# Patient Record
Sex: Female | Born: 2000 | Race: White | Hispanic: No | Marital: Single | Smoking: Never smoker
Health system: Southern US, Community
[De-identification: ages and names within clinical notes are randomized; demographics above are authoritative.]

## PROBLEM LIST (undated history)

## (undated) DIAGNOSIS — R569 Unspecified convulsions: Secondary | ICD-10-CM

## (undated) DIAGNOSIS — T7840XA Allergy, unspecified, initial encounter: Secondary | ICD-10-CM

## (undated) HISTORY — DX: Unspecified convulsions: R56.9

## (undated) HISTORY — DX: Allergy, unspecified, initial encounter: T78.40XA

---
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
## 2018-03-25 ENCOUNTER — Ambulatory Visit (INDEPENDENT_AMBULATORY_CARE_PROVIDER_SITE_OTHER): Payer: BLUE CROSS/BLUE SHIELD | Admitting: Pediatrics

## 2018-03-25 ENCOUNTER — Encounter: Payer: Self-pay | Admitting: Pediatrics

## 2018-03-25 VITALS — BP 110/70 | Ht 61.25 in | Wt 106.3 lb

## 2018-03-25 DIAGNOSIS — Z68.41 Body mass index (BMI) pediatric, 5th percentile to less than 85th percentile for age: Secondary | ICD-10-CM

## 2018-03-25 DIAGNOSIS — Z0000 Encounter for general adult medical examination without abnormal findings: Secondary | ICD-10-CM

## 2018-03-25 DIAGNOSIS — Z00129 Encounter for routine child health examination without abnormal findings: Principal | ICD-10-CM

## 2018-03-25 MED ORDER — MOMETASONE FUROATE 0.1 % EX CREA: TOPICAL_CREAM | CUTANEOUS | 12 refills | Status: AC

## 2018-03-25 MED ORDER — CLINDAMYCIN PHOSPHATE 1 % EX SOLN: Freq: Two times a day (BID) | CUTANEOUS | 12 refills | Status: AC

## 2018-03-25 NOTE — Patient Instructions (Signed)
Acne  Acne is a skin problem that causes small, red bumps (pimples). Acne happens when the tiny holes in your skin (pores) get blocked. Your pores may become red, sore, and swollen. They may also become infected. Acne is a common skin problem. It is especially common in teenagers. Acne usually goes away over time.  Follow these instructions at home:  Good skin care is the most important thing you can do to treat your acne. Take care of your skin as told by your doctor. You may be told to do these things:  · Wash your skin gently at least two times each day. You should also wash your skin:  ? After you exercise.  ? Before you go to bed.  · Use mild soap.  · Use a water-based skin moisturizer after you wash your skin.  · Use a sunscreen or sunblock with SPF 30 or greater. This is very important if you are using acne medicines.  · Choose cosmetics that will not plug your oil glands (are noncomedogenic).    Medicines  · Take over-the-counter and prescription medicines only as told by your doctor.  · If you were prescribed an antibiotic medicine, apply or take it as told by your doctor. Do not stop using the antibiotic even if your acne improves.  General instructions  · Keep your hair clean and off of your face. Shampoo your hair regularly. If you have oily hair, you may need to wash it every day.  · Avoid leaning your chin or forehead on your hands.  · Avoid wearing tight headbands or hats.  · Avoid picking or squeezing your pimples. That can make your acne worse and cause scarring.  · Keep all follow-up visits as told by your doctor. This is important.  · Shave gently. Only shave when it is necessary.  · Keep a food journal. This can help you to see if any foods are linked with your acne.  Contact a doctor if:  · Your acne is not better after eight weeks.  · Your acne gets worse.  · You have a large area of skin that is red or tender.  · You think that you are having side effects from any acne medicine.  This  information is not intended to replace advice given to you by your health care provider. Make sure you discuss any questions you have with your health care provider.  Document Released: 05/29/2011 Document Revised: 11/15/2015 Document Reviewed: 08/16/2014  Elsevier Interactive Patient Education © 2018 [employer] Inc[employer]

## 2018-03-25 NOTE — Progress Notes (Signed)
Adolescent Well Care Visit Tara Faulkner is a 17 y.o. female who is here for well care.    PCP:  Georgiann Hahn, MD   History was provided by the patient and mother.  Confidentiality was discussed with the patient and, if applicable, with caregiver as well. Patient's personal or confidential phone number: n/a   Current Issues: Current concerns include .  Carpal tunnel --right wrist--refer to ortho--shoulder and  neck pain Acne  Nutrition: Nutrition/Eating Behaviors: normal  Adequate calcium in diet?: yes Supplements/ Vitamins: yes  Exercise/ Media: Play any Sports?/ Exercise: yes Screen Time:  < 2 hours Media Rules or Monitoring?: yes  Sleep:  Sleep: good  Social Screening: Lives with:  parents Parental relations:  good Activities, Work, and Regulatory affairs officer?: yes Concerns regarding behavior with peers?  no Stressors of note: no  Education: Completed school---taking this year off before college School performance: doing well; no concerns School Behavior: doing well; no concerns  Menstruation:   No LMP recorded. Menstrual History: normal   Confidential Social History: Tobacco?  no Secondhand smoke exposure?  no Drugs/ETOH?  no  Sexually Active?  no   Pregnancy Prevention: abstinence  Safe at home, in school & in relationships?  Yes Safe to self?  Yes   Screenings: Patient has a dental home: yes  The patient completed the Rapid Assessment for Adolescent Preventive Services screening questionnaire and the following topics were identified as risk factors and discussed: healthy eating, exercise, seatbelt use, bullying, abuse/trauma, weapon use, tobacco use, marijuana use, drug use, condom use, birth control, sexuality, suicidality/self harm, mental health issues, social isolation, school problems, family problems and screen time  In addition, the following topics were discussed as part of anticipatory guidance healthy eating and exercise.  PHQ-9 completed and  results indicated No risk  Physical Exam:  Vitals:   03/25/18 1524  BP: 110/70  Weight: 106 lb 4.8 oz (48.2 kg)  Height: 5' 1.25" (1.556 m)   BP 110/70   Ht 5' 1.25" (1.556 m)   Wt 106 lb 4.8 oz (48.2 kg)   BMI 19.92 kg/m  Body mass index: body mass index is 19.92 kg/m. Blood pressure percentiles are not available for patients who are 18 years or older.   Visual Acuity Screening   Right eye Left eye Both eyes  Without correction:     With correction: 10/8 10/8     General Appearance:   alert, oriented, no acute distress and well nourished  HENT: Normocephalic, no obvious abnormality, conjunctiva clear  Mouth:   Normal appearing teeth, no obvious discoloration, dental caries, or dental caps  Neck:   Supple; thyroid: no enlargement, symmetric, no tenderness/mass/nodules  Chest n/a  Lungs:   Clear to auscultation bilaterally, normal work of breathing  Heart:   Regular rate and rhythm, S1 and S2 normal, no murmurs;   Abdomen:   Soft, non-tender, no mass, or organomegaly  GU genitalia not examined  Musculoskeletal:   Tone and strength strong and symmetrical, all extremities               Lymphatic:   No cervical adenopathy  Skin/Hair/Nails:   Skin warm, dry and intact, no rashes, no bruises or petechiae  Neurologic:   Strength, gait, and coordination normal and age-appropriate     Assessment and Plan:   Well young adult  BMI is appropriate for age  Hearing screening result:normal Vision screening result: normal   Counseled on men B--uses and benefits and written literature given  Return in about 1 year (around 03/26/2019).Georgiann Hahn, MD

## 2019-05-14 ENCOUNTER — Emergency Department (HOSPITAL_COMMUNITY)
Admission: EM | Admit: 2019-05-14 | Discharge: 2019-05-14 | Disposition: A | Discharge status: Home / Self Care | Payer: BC Managed Care – PPO | Attending: Emergency Medicine | Admitting: Emergency Medicine

## 2019-05-14 ENCOUNTER — Other Ambulatory Visit: Payer: Self-pay

## 2019-05-14 ENCOUNTER — Emergency Department (HOSPITAL_COMMUNITY): Payer: BC Managed Care – PPO

## 2019-05-14 ENCOUNTER — Encounter (HOSPITAL_COMMUNITY): Payer: Self-pay | Admitting: Emergency Medicine

## 2019-05-14 DIAGNOSIS — R000 Tachycardia, unspecified: Secondary | ICD-10-CM | POA: Diagnosis not present

## 2019-05-14 DIAGNOSIS — R259 Unspecified abnormal involuntary movements: Secondary | ICD-10-CM | POA: Insufficient documentation

## 2019-05-14 DIAGNOSIS — R569 Unspecified convulsions: Secondary | ICD-10-CM | POA: Diagnosis not present

## 2019-05-14 DIAGNOSIS — Z79899 Other long term (current) drug therapy: Secondary | ICD-10-CM | POA: Insufficient documentation

## 2019-05-14 DIAGNOSIS — R270 Ataxia, unspecified: Secondary | ICD-10-CM | POA: Diagnosis not present

## 2019-05-14 LAB — URINALYSIS, ROUTINE W REFLEX MICROSCOPIC
Bilirubin Urine: NEGATIVE
Glucose, UA: NEGATIVE mg/dL
Hgb urine dipstick: NEGATIVE
Ketones, ur: NEGATIVE mg/dL
Leukocytes,Ua: NEGATIVE
Nitrite: NEGATIVE
Protein, ur: NEGATIVE mg/dL
Specific Gravity, Urine: 1.02 (ref 1.005–1.030)
pH: 5 (ref 5.0–8.0)

## 2019-05-14 LAB — BASIC METABOLIC PANEL
Anion gap: 11 (ref 5–15)
BUN: 10 mg/dL (ref 6–20)
CO2: 23 mmol/L (ref 22–32)
Calcium: 9.8 mg/dL (ref 8.9–10.3)
Chloride: 108 mmol/L (ref 98–111)
Creatinine, Ser: 0.78 mg/dL (ref 0.44–1.00)
GFR calc Af Amer: >60 mL/min (ref >60)
GFR calc non Af Amer: >60 mL/min (ref >60)
Glucose, Bld: 121 mg/dL — ABNORMAL HIGH (ref 70–99)
Potassium: 3.7 mmol/L (ref 3.5–5.1)
Sodium: 142 mmol/L (ref 135–145)

## 2019-05-14 LAB — CBC WITH DIFFERENTIAL/PLATELET
Abs Immature Granulocytes: 0.01 10*3/uL (ref 0.00–0.07)
Basophils Absolute: 0 10*3/uL (ref 0.0–0.1)
Basophils Relative: 0 %
Eosinophils Absolute: 0 10*3/uL (ref 0.0–0.5)
Eosinophils Relative: 0 %
HCT: 43.9 % (ref 36.0–46.0)
Hemoglobin: 14.5 g/dL (ref 12.0–15.0)
Immature Granulocytes: 0 %
Lymphocytes Relative: 21 %
Lymphs Abs: 1.5 10*3/uL (ref 0.7–4.0)
MCH: 29.1 pg (ref 26.0–34.0)
MCHC: 33 g/dL (ref 30.0–36.0)
MCV: 88 fL (ref 80.0–100.0)
Monocytes Absolute: 0.4 10*3/uL (ref 0.1–1.0)
Monocytes Relative: 6 %
Neutro Abs: 5 10*3/uL (ref 1.7–7.7)
Neutrophils Relative %: 73 %
Platelets: 226 10*3/uL (ref 150–400)
RBC: 4.99 MIL/uL (ref 3.87–5.11)
RDW: 11.9 % (ref 11.5–15.5)
WBC: 6.9 10*3/uL (ref 4.0–10.5)
nRBC: 0 % (ref 0.0–0.2)

## 2019-05-14 LAB — RAPID URINE DRUG SCREEN, HOSP PERFORMED
Amphetamines: NOT DETECTED
Barbiturates: NOT DETECTED
Benzodiazepines: NOT DETECTED
Cocaine: NOT DETECTED
Opiates: NOT DETECTED
Tetrahydrocannabinol: NOT DETECTED

## 2019-05-14 LAB — I-STAT BETA HCG BLOOD, ED (MC, WL, AP ONLY): I-stat hCG, quantitative: <5 m[IU]/mL (ref <5)

## 2019-05-14 MED ORDER — DIPHENHYDRAMINE HCL 25 MG PO CAPS
25.0000 mg | ORAL_CAPSULE | Freq: Once | ORAL | Status: DC
  Filled 2019-05-14: qty 1

## 2019-05-14 MED ORDER — LORAZEPAM 2 MG/ML IJ SOLN
2.0000 mg | Freq: Once | INTRAMUSCULAR | Status: AC
  Administered 2019-05-14: 2 mg via INTRAVENOUS
  Filled 2019-05-14: qty 1

## 2019-05-14 MED ORDER — DIPHENHYDRAMINE HCL 25 MG PO CAPS
25.0000 mg | ORAL_CAPSULE | Freq: Once | ORAL | Status: AC
  Administered 2019-05-14: 25 mg via ORAL
  Filled 2019-05-14: qty 1

## 2019-05-14 MED ORDER — ONDANSETRON 8 MG PO TBDP
8.0000 mg | ORAL_TABLET | Freq: Once | ORAL | Status: AC
  Administered 2019-05-14: 8 mg via ORAL
  Filled 2019-05-14: qty 1

## 2019-05-14 NOTE — ED Notes (Signed)
ED Provider at bedside. 

## 2019-05-14 NOTE — ED Notes (Signed)
Patient transported to MRI 

## 2019-05-14 NOTE — ED Notes (Signed)
Attempted to get an EKG, unable to because patient's twitching was too bad.

## 2019-05-14 NOTE — Consult Note (Signed)
NEURO HOSPITALIST CONSULT NOTE   Requestig physician: Dr. Billy Fischer  Reason for Consult: Abnormal movements  History obtained from:   Patient and Chart     HPI:                                                                                                                                          Tara Faulkner is an 18 y.o. female who presents with a one week history of lower and upper extremity abnormal movements. She states that her friend developed this problem several weeks ago and still had abnormal movements and paroxysmal inappropriate verbal output as of their last spending time together on Thursday 11/12. The patient's own symptoms began on Sunday with onset of abnormal movements of her BLE, which gradually migrated up her legs, then involving her arms and neck. She has also developed abnormal verbal output with spontaneous uncontrollable utterances of "color words" such as "green" and "red" while she was in the [facility] ED. She has a history of anxiety and seasonal depression for which she is not medicated. She states that she also has a history of obsessions and compulsions, specifically intrusive thoughts followed by a strong urge to clean her room etc in order to suppress/control the intrusive thoughts. She was sent over from [facility] for an MRI and possibly an EEG.   She denies any other symptoms, including no fever or symptoms of infection. Denies recent history of strep throat.   Past Medical History:  Diagnosis Date  . Allergy     History reviewed. No pertinent surgical history.  Family History  Problem Relation Age of Onset  . Cancer Maternal Grandmother        Uterine  . Cancer Maternal Grandfather        Liver ca  . Hypertension Maternal Grandfather   . Diabetes Paternal Grandmother   . Arthritis Neg Hx   . Asthma Neg Hx   . COPD Neg Hx   . Depression Neg Hx   . Hyperlipidemia Neg Hx   . Heart disease Neg Hx   . Hearing loss Neg Hx   . Early death Neg  Hx   . Drug abuse Neg Hx   . Kidney disease Neg Hx   . Learning disabilities Neg Hx   . Mental illness Neg Hx   . Mental retardation Neg Hx   . Miscarriages / Stillbirths Neg Hx   . Stroke Neg Hx   . Vision loss Neg Hx   . Alcohol abuse Neg Hx   . Varicose Veins Neg Hx               Social History:  reports that she has never smoked. She has never used smokeless tobacco. She reports that she does not drink alcohol or  use drugs.  No Known Allergies  HOME MEDICATIONS:                                                                                                                     No current facility-administered medications on file prior to encounter.    Current Outpatient Medications on File Prior to Encounter  Medication Sig Dispense Refill  . desonide (DESOWEN) 0.05 % cream APPLY TOPICALLY 2 (TWO) TIMES DAILY. (Patient taking differently: Apply 1 application topically 2 (two) times daily. ) 60 g 6  . loratadine (CLARITIN) 10 MG tablet Take 10 mg by mouth daily.    . mometasone (ELOCON) 0.1 % cream Apply 1 application topically daily.    Marland Kitchen. ELIDEL 1 % cream APPLY TWICE DAILY TO RASH UNTIL CLEAR (Patient taking differently: Apply 1 application topically 2 (two) times daily. ) 60 g 1     ROS:                                                                                                                                       As per HPI. Comprehensive ROS is otherwise negative.    Blood pressure 116/83, pulse (!) 101, temperature 99.6 F (37.6 C), temperature source Oral, resp. rate 14, height 5\' 1"  (1.549 m), weight 49.9 kg, last menstrual period 04/17/2019, SpO2 97 %.   General Examination:                                                                                                       Physical Exam  HEENT-  [place]/AT    Lungs- Respirations unlabored.  Extremities- No edema   Neurological Examination Mental Status: Alert, fully oriented, thought content appropriate.   Speech fluent without evidence of aphasia.  Able to follow all commands without difficulty. No vocal tics noted.  Cranial Nerves: II: Visual fields intact with no extinction to DSS. PERRL.  III,IV, VI: No ptosis. EOMI without nystagmus.  V,VII: Face symmetric. Temp and FT sensation  equal bilaterally  VIII: hearing intact to conversation IX,X: No hypophonia XI: Symmetric XII: midline tongue extension Motor: Right : Upper extremity   5/5    Left:     Upper extremity   5/5  Lower extremity   5/5     Lower extremity   5/5 No pronator drift. No tremor. Tone and bulk normal.  Exhibits intermittent contorted movements of upper > lower extremities involving multiple joints when occurring, varying in amplitude and direction as well as the specific joints involved. The movements are complex and not stereotyped, being different with each occurrence. Example includes flexion at elbow while adducting at shoulder, crossing arm over chest and snapping her fingers (left arm), while flexing her RUE and crossing it over her chest, with simultaneous flexion of the neck forwards and laterally. No facial tics noted. Movements become worse when discussing the movements themselves, as well as when asking about possible psychosocial stressors. The movements were absent for about one minute after examiner entered the room, then gradually increased in frequency and severity during the course of the interview/exam.  Sensory: Temp and light touch intact throughout, bilaterally. No extinction.  Deep Tendon Reflexes: 2+ and symmetric throughout Plantars: Right: downgoing   Left: downgoing Cerebellar: No ataxia with FNF bilaterally.  Gait: Deferred   Lab Results: Basic Metabolic Panel: Recent Labs  Lab 05/14/19 0553  NA 142  K 3.7  CL 108  CO2 23  GLUCOSE 121*  BUN 10  CREATININE 0.78  CALCIUM 9.8    CBC: Recent Labs  Lab 05/14/19 0553  WBC 6.9  NEUTROABS 5.0  HGB 14.5  HCT 43.9  MCV 88.0  PLT 226     Cardiac Enzymes: No results for input(s): CKTOTAL, CKMB, CKMBINDEX, TROPONINI in the last 168 hours.  Lipid Panel: No results for input(s): CHOL, TRIG, HDL, CHOLHDL, VLDL, LDLCALC in the last 168 hours.  Imaging: Ct Head Wo Contrast  Result Date: 05/14/2019 CLINICAL DATA:  Ataxia, stroke suspected. Twitching of arms and upper body. EXAM: CT HEAD WITHOUT CONTRAST TECHNIQUE: Contiguous axial images were obtained from the base of the skull through the vertex without intravenous contrast. COMPARISON:  None. FINDINGS: Brain: Uncontrollable motion artifact. No intracranial hemorrhage, mass effect, or midline shift. No hydrocephalus. The basilar cisterns are patent. No evidence of territorial infarct or acute ischemia. No extra-axial or intracranial fluid collection. Vascular: No obvious hyperdense vessel, motion artifact through the area of the MCAs. Skull: Normal. Negative for fracture or focal lesion. Sinuses/Orbits: Paranasal sinuses and mastoid air cells are clear. The visualized orbits are unremarkable. Other: None. IMPRESSION: 1. Uncontrollable motion artifact limits the exam. 2. No evidence of acute intracranial abnormality. Electronically Signed   By: Narda Rutherford M.D.   On: 05/14/2019 06:13   Mr Brain Wo Contrast  Result Date: 05/14/2019 CLINICAL DATA:  Seizure, new, nontraumatic EXAM: MRI HEAD WITHOUT CONTRAST TECHNIQUE: Multiplanar, multiecho pulse sequences of the brain and surrounding structures were obtained without intravenous contrast. COMPARISON:  Head CT earlier today FINDINGS: Brain: No infarction, hemorrhage, hydrocephalus, extra-axial collection or mass lesion. Normal brain volume. No white matter disease. Vascular: Normal flow voids Skull and upper cervical spine: Normal marrow signal Sinuses/Orbits: Presumed retention cyst in the inferior right maxillary sinus. Negative orbits IMPRESSION: Normal brain MRI. Electronically Signed   By: Marnee Spring M.D.   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 Overall impression is that this is not secondary to an  autoimmune encephalopathy or Tourette's syndrome. 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 2. MRI brain is normal. Images were reviewed with the patient and her father 3. Movements are not consistent with epileptic seizure activity. EEG not indicated.   Recommendations: 1. Psychodynamic psychotherapy outpatient appointment is recommended 2. Follow up with [place] [facility] (Dr. Arbutus Leas) for second opinion. 3. The patient would like to defer possible medication until after her appointment at [facility]. Discussed benefits risks of Prozac and Buspar for depression/anxiety and anxiety treatment, respectively.  4. The patient has consented to a one time dose of 2 mg IV Ativan to assess efficacy of anxiolytic medication.   Electronically signed: Dr. Caryl Pina 05/14/2019, 11:23 AM

## 2019-05-14 NOTE — ED Triage Notes (Signed)
Patient is having twitching involving her arms and upper body that started about an hour ago. She is tachycardic in triage, unable to control body movements.

## 2019-05-14 NOTE — ED Provider Notes (Signed)
  Physical Exam  BP 116/83   Pulse (!) 101   Temp 99.6 F (37.6 C) (Oral)   Resp 14   Ht 5\' 1"  (1.549 m)   Wt 49.9 kg   LMP 04/17/2019   SpO2 97%   BMI 20.78 kg/m   Physical Exam  ED Course/Procedures     Procedures  MDM  Received care of pt transferred from Dr. Stark Jock at [facility] to [facility] for Neurology evaluation.  18yo female presenting with abnormal movements.  Friend also noted to have similar symptoms develop one month ago.  No medications/drug use. No fever, no sign of encephalitis.    MRI without acute abnormalities. Dr. Cheral Marker Neurology evaluated the patient, feels movements are not consistent with seizure like activity, do not see other emergent etiology of symptoms.  Recommend follow up with movement clinic Neurologist, provided number.       Gareth Morgan, MD 05/14/19 2259

## 2019-05-14 NOTE — ED Triage Notes (Addendum)
Pt c/o twitching to legs and arms that started yesterday.  Reports stuttering and snapping that started today.  States she is unable to control body movements.  Pt transferred from [facility] to see neurologist.

## 2019-05-14 NOTE — ED Notes (Signed)
Patient verbalizes understanding of discharge instructions. Opportunity for questioning and answers were provided. Armband removed by staff, pt discharged from ED.  

## 2019-05-14 NOTE — ED Provider Notes (Signed)
[facility]-EMERGENCY DEPT Provider Note   CSN: [id number] Arrival date & time: 05/14/19  0049     History   Chief Complaint Chief Complaint  Patient presents with  . body twitching    HPI Tara Faulkner is a 18 y.o. female.     Patient is a 18 year old female with no significant past medical history.  She presents today with complaints of involuntary twitching of her entire body.  This is been occurring for the past several hours.  Patient states that she has been doing research on Tourette's for the past several months because she finds it interesting.  She now believes that she has developed this disease.  She denies any drug or alcohol use.  She denies any fever or chills.  The history is provided by the patient.    Past Medical History:  Diagnosis Date  . Allergy     Patient Active Problem List   Diagnosis Date Noted  . BMI (body mass index), pediatric, 5% to less than 85% for age 82/02/2015  . Well adolescent visit 01/24/2013    History reviewed. No pertinent surgical history.   OB History   No obstetric history on file.      Home Medications    Prior to Admission medications   Medication Sig Start Date End Date Taking? Authorizing Provider  desonide (DESOWEN) 0.05 % cream APPLY TOPICALLY 2 (TWO) TIMES DAILY. 02/04/16   Georgiann Hahn, MD  ELIDEL 1 % cream APPLY TWICE DAILY TO RASH UNTIL CLEAR 03/28/13   Georgiann Hahn, MD    Family History Family History  Problem Relation Age of Onset  . Cancer Maternal Grandmother        Uterine  . Cancer Maternal Grandfather        Liver ca  . Hypertension Maternal Grandfather   . Diabetes Paternal Grandmother   . Arthritis Neg Hx   . Asthma Neg Hx   . COPD Neg Hx   . Depression Neg Hx   . Hyperlipidemia Neg Hx   . Heart disease Neg Hx   . Hearing loss Neg Hx   . Early death Neg Hx   . Drug abuse Neg Hx   . Kidney disease Neg Hx   . Learning disabilities Neg Hx   . Mental illness  Neg Hx   . Mental retardation Neg Hx   . Miscarriages / Stillbirths Neg Hx   . Stroke Neg Hx   . Vision loss Neg Hx   . Alcohol abuse Neg Hx   . Varicose Veins Neg Hx     Social History Social History   Tobacco Use  . Smoking status: Never Smoker  . Smokeless tobacco: Never Used  Substance Use Topics  . Alcohol use: No  . Drug use: No     Allergies   Patient has no known allergies.   Review of Systems Review of Systems  All other systems reviewed and are negative.    Physical Exam Updated Vital Signs BP 117/79 (BP Location: Left Arm)   Pulse (!) 110   Temp 99.2 F (37.3 C) (Oral)   Resp 16   Ht 5\' 1"  (1.549 m)   Wt 49.9 kg   SpO2 98%   BMI 20.78 kg/m   Physical Exam Vitals signs and nursing note reviewed.  Constitutional:      General: She is not in acute distress.    Appearance: She is well-developed. She is not diaphoretic.  HENT:     Head:  Normocephalic and atraumatic.  Neck:     Musculoskeletal: Normal range of motion and neck supple.  Cardiovascular:     Rate and Rhythm: Normal rate and regular rhythm.     Heart sounds: No murmur. No friction rub. No gallop.   Pulmonary:     Effort: Pulmonary effort is normal. No respiratory distress.     Breath sounds: Normal breath sounds. No wheezing.  Abdominal:     General: Bowel sounds are normal. There is no distension.     Palpations: Abdomen is soft.     Tenderness: There is no abdominal tenderness.  Musculoskeletal: Normal range of motion.  Skin:    General: Skin is warm and dry.  Neurological:     General: No focal deficit present.     Mental Status: She is alert and oriented to person, place, and time.     Cranial Nerves: No cranial nerve deficit.     Motor: No weakness.     Coordination: Coordination normal.     Comments: Every few seconds, patient has a jerking motion that appears voluntary.  Patient observed from outside the room and did not have this occur.  It only seems to occur while I am  in the exam room speaking with her.      ED Treatments / Results  Labs (all labs ordered are listed, but only abnormal results are displayed) Labs Reviewed  BASIC METABOLIC PANEL  CBC WITH DIFFERENTIAL/PLATELET  I-STAT BETA HCG BLOOD, ED ([facility], [facility], [facility] ONLY)    EKG None  Radiology No results found.  Procedures Procedures (including critical care time)  Medications Ordered in ED Medications  diphenhydrAMINE (BENADRYL) capsule 25 mg (has no administration in time range)  diphenhydrAMINE (BENADRYL) capsule 25 mg (25 mg Oral Given 05/14/19 0301)     Initial Impression / Assessment and Plan / ED Course  I have reviewed the triage vital signs and the nursing notes.  Pertinent labs & imaging results that were available during my care of the patient were reviewed by me and considered in my medical decision making (see chart for details).  Patient presenting with complaints of twitching as described in the HPI.  I am uncertain as to the etiology of this, but she otherwise appears neurologically intact.  Her head CT is negative and laboratory studies are unremarkable.  I have discussed the care with Dr. Rory Percy from neurology.  He feels as though the patient would be best served by transfer to [facility] for formal neurologic consultation and possible EEG/MRI.  Patient to be transported private auto by her father who was present at bedside.  Final Clinical Impressions(s) / ED Diagnoses   Final diagnoses:  None    ED Discharge Orders    None       Veryl Speak, MD 05/14/19 [phone]

## 2019-05-14 NOTE — ED Notes (Signed)
While patient is asleep, no twitching noted.

## 2019-05-16 ENCOUNTER — Institutional Professional Consult (permissible substitution): Payer: BLUE CROSS/BLUE SHIELD | Admitting: Pediatrics

## 2019-05-16 ENCOUNTER — Telehealth: Payer: Self-pay | Admitting: Pediatrics

## 2019-05-16 NOTE — Telephone Encounter (Deleted)
Child was seen in ER for tourettes like symptoms on Friday . Needs to be seen for follow up but ER DR seems to think it's all in her head .

## 2019-05-17 NOTE — Telephone Encounter (Signed)
Virtual visit for follow up done

## 2019-06-03 ENCOUNTER — Other Ambulatory Visit: Payer: Self-pay

## 2019-06-03 ENCOUNTER — Ambulatory Visit: Payer: BC Managed Care – PPO | Admitting: Diagnostic Neuroimaging

## 2019-06-03 ENCOUNTER — Encounter: Payer: Self-pay | Admitting: Diagnostic Neuroimaging

## 2019-06-03 VITALS — BP 128/86 | HR 133 | Temp 97.6°F | Ht 61.0 in | Wt 106.2 lb

## 2019-06-03 DIAGNOSIS — R259 Unspecified abnormal involuntary movements: Secondary | ICD-10-CM | POA: Diagnosis not present

## 2019-06-03 DIAGNOSIS — F419 Anxiety disorder, unspecified: Secondary | ICD-10-CM | POA: Diagnosis not present

## 2019-06-03 DIAGNOSIS — R250 Abnormal head movements: Secondary | ICD-10-CM | POA: Diagnosis not present

## 2019-06-03 DIAGNOSIS — F429 Obsessive-compulsive disorder, unspecified: Secondary | ICD-10-CM

## 2019-06-03 DIAGNOSIS — R6889 Other general symptoms and signs: Secondary | ICD-10-CM | POA: Diagnosis not present

## 2019-06-03 DIAGNOSIS — R799 Abnormal finding of blood chemistry, unspecified: Secondary | ICD-10-CM | POA: Diagnosis not present

## 2019-06-03 NOTE — Patient Instructions (Signed)
ABNORMAL INVOLUNTARY MOVEMENTS - possible conversion reaction after exposure to knowledge of friend's similar symptoms - not likely tourette's syndrome due to sudden onset of symptoms; triggered event (friend's symptoms) and age of onset (18 years old) - recommend trial of psychology counseling - may consider SSRI in future

## 2019-06-03 NOTE — Progress Notes (Signed)
[facility]  PATIENT: Tara Faulkner DOB: 04/10/2001  REFERRING CLINICIAN: Schlossman HISTORY FROM: patient  REASON FOR VISIT: new consult   HISTORICAL  CHIEF COMPLAINT:  Chief Complaint  Patient presents with  . New Patient (Initial Visit)    Rm 7, alone (mom out in waiting area).  No family hx that she is aware.  Marland Kitchen. Possible tourettes    Started fairly recent twitches for month.  Started legs then gradually moved up.      HISTORY OF PRESENT ILLNESS:   18 year old female here for evaluation of abnormal involuntary movements.    Patient has history of anxiety and seasonal depression.  Patient had normal birth and development.  She has been homeschooled until age 18 years old.  She has been living at home since that time, thinking about going to college but she is not sure yet.  Patient did have slightly increased depression symptoms in 2018 where she "did not move that much" at home and thought about seeing a counselor or getting a medication, but did not pursue this.  Early November 2020, patient's friend developed involuntary movements and twitching and shared a video with the patient. Within 1 to 2 weeks the patient started developing similar symptoms herself around May 09, 2019.  Patient describes an uncontrollable urge and need to move her head, neck, shoulders, hands, sometimes associated with making a sound or seeing a particular word.  Sometimes these movements and sounds can be triggered if someone says a particular phrase to her where she sees something.  Apparently patient's brother even intentionally did this to the patient to see if he can trigger these vocalizations and the patient.  Symptoms gradually built up until May 14, 2019 when the severity of symptoms led patient to go to the emergency room for evaluation.  Lab testing, CT and MRI were obtained.  Neurology consultation was obtained.  All testing was essentially unremarkable.   Consideration of possible psychogenic movement disorder or conversion reaction was raised.  Seizure and Tourette syndrome were not felt to be likely.  Patient was referred to outpatient movement disorder specialist for follow-up.  Patient denies any similar symptoms like this when she was younger in her preteen or teenage years.  Symptoms have been fairly sudden onset just in November 2020.   REVIEW OF SYSTEMS: Full 14 system review of systems performed and negative with exception of: As per HPI.  ALLERGIES: No Known Allergies  HOME MEDICATIONS: Outpatient Medications Prior to Visit  Medication Sig Dispense Refill  . desonide (DESOWEN) 0.05 % cream APPLY TOPICALLY 2 (TWO) TIMES DAILY. (Patient taking differently: Apply 1 application topically 2 (two) times daily. ) 60 g 6  . ELIDEL 1 % cream APPLY TWICE DAILY TO RASH UNTIL CLEAR (Patient taking differently: Apply 1 application topically 2 (two) times daily. ) 60 g 1  . loratadine (CLARITIN) 10 MG tablet Take 10 mg by mouth daily.    . mometasone (ELOCON) 0.1 % cream Apply 1 application topically daily.     No facility-administered medications prior to visit.    PAST MEDICAL HISTORY: Past Medical History:  Diagnosis Date  . Allergy     PAST SURGICAL HISTORY: No past surgical history on file.  FAMILY HISTORY: Family History  Problem Relation Age of Onset  . Cancer Maternal Grandmother        Uterine  . Cancer Maternal Grandfather        Liver ca  . Hypertension Maternal Grandfather   . Diabetes  Paternal Grandmother   . Arthritis Neg Hx   . Asthma Neg Hx   . COPD Neg Hx   . Depression Neg Hx   . Hyperlipidemia Neg Hx   . Heart disease Neg Hx   . Hearing loss Neg Hx   . Early death Neg Hx   . Drug abuse Neg Hx   . Kidney disease Neg Hx   . Learning disabilities Neg Hx   . Mental illness Neg Hx   . Mental retardation Neg Hx   . Miscarriages / Stillbirths Neg Hx   . Stroke Neg Hx   . Vision loss Neg Hx   . Alcohol  abuse Neg Hx   . Varicose Veins Neg Hx     SOCIAL HISTORY: Social History   Socioeconomic History  . Marital status: Single    Spouse name: Not on file  . Number of children: Not on file  . Years of education: Not on file  . Highest education level: Not on file  Occupational History  . Not on file  Tobacco Use  . Smoking status: Never Smoker  . Smokeless tobacco: Never Used  Substance and Sexual Activity  . Alcohol use: No  . Drug use: No  . Sexual activity: Never  Other Topics Concern  . Not on file  Social History Narrative   HS grad., home schooled.  Not employed.  Living home. 1 brother (married).    Social Determinants of Health   Financial Resource Strain:   . Difficulty of Paying Living Expenses: Not on file  Food Insecurity:   . Worried About Programme researcher, broadcasting/film/video in the Last Year: Not on file  . Ran Out of Food in the Last Year: Not on file  Transportation Needs:   . Lack of Transportation (Medical): Not on file  . Lack of Transportation (Non-Medical): Not on file  Physical Activity:   . Days of Exercise per Week: Not on file  . Minutes of Exercise per Session: Not on file  Stress:   . Feeling of Stress : Not on file  Social Connections:   . Frequency of Communication with Friends and Family: Not on file  . Frequency of Social Gatherings with Friends and Family: Not on file  . Attends Religious Services: Not on file  . Active Member of Clubs or Organizations: Not on file  . Attends Banker Meetings: Not on file  . Marital Status: Not on file  Intimate Partner Violence:   . Fear of Current or Ex-Partner: Not on file  . Emotionally Abused: Not on file  . Physically Abused: Not on file  . Sexually Abused: Not on file     PHYSICAL EXAM  GENERAL EXAM/CONSTITUTIONAL: Vitals:  Vitals:   06/03/19 0918 06/03/19 0935  BP: 128/86   Pulse: (!) 150 (!) 133  Temp: 97.6 F (36.4 C)   SpO2:  97%  Weight: 106 lb 3.2 oz (48.2 kg)   Height:   (1.549 m)      Body mass index is 20.07 kg/m. Wt Readings from Last 3 Encounters:  06/03/19 106 lb 3.2 oz (48.2 kg)  05/14/19 110 lb (49.9 kg)  03/25/18 106 lb 4.8 oz (48.2 kg) (10 %, Z= -1.29)*   * Growth percentiles are based on CDC (Girls, 2-20 Years) data.     Patient is in no distress; well developed, nourished and groomed; neck is supple  INTERMITTENT QUICK MOVEMENTS OF HANDS, LEGS, HEAD AND NECK; SHOULDER SHRUGGING; DYSTONIC POSTURING  OF LEFT HAND; STEREOTYPIES o  INTERMITTENT VOCALIZATIONS ("ooh")  SYMPTOMS WAX AND WANE; worsen when patient feels anxious  CARDIOVASCULAR:  Examination of carotid arteries is normal; no carotid bruits  Regular rate and rhythm, no murmurs  Examination of peripheral vascular system by observation and palpation is normal  EYES:  Ophthalmoscopic exam of optic discs and posterior segments is normal; no papilledema or hemorrhages  No exam data present  MUSCULOSKELETAL:  Gait, strength, tone, movements noted in Neurologic exam below  NEUROLOGIC: MENTAL STATUS:  No flowsheet data found.  awake, alert, oriented to person, place and time  recent and remote memory intact  normal attention and concentration  language fluent, comprehension intact, naming intact  fund of knowledge appropriate  CRANIAL NERVE:   2nd - no papilledema on fundoscopic exam  2nd, 3rd, 4th, 6th - pupils equal and reactive to light, visual fields full to confrontation, extraocular muscles intact, no nystagmus  5th - facial sensation symmetric  7th - facial strength symmetric  8th - hearing intact  9th - palate elevates symmetrically, uvula midline  11th - shoulder shrug symmetric  12th - tongue protrusion midline  MOTOR:   normal bulk and tone, full strength in the BUE, BLE  SENSORY:   normal and symmetric to light touch, temperature, vibration  COORDINATION:   finger-nose-finger, fine finger movements normal  REFLEXES:   deep  tendon reflexes present and symmetric  GAIT/STATION:   narrow based gait     DIAGNOSTIC DATA (LABS, IMAGING, TESTING) - I reviewed patient records, labs, notes, testing and imaging myself where available.  Lab Results  Component Value Date   WBC 6.9 05/14/2019   HGB 14.5 05/14/2019   HCT 43.9 05/14/2019   MCV 88.0 05/14/2019   PLT 226 05/14/2019      Component Value Date/Time   NA 142 05/14/2019 0553   K 3.7 05/14/2019 0553   CL 108 05/14/2019 0553   CO2 23 05/14/2019 0553   GLUCOSE 121 (H) 05/14/2019 0553   BUN 10 05/14/2019 0553   CREATININE 0.78 05/14/2019 0553   CALCIUM 9.8 05/14/2019 0553   GFRNONAA >60 05/14/2019 0553   GFRAA >60 05/14/2019 0553   No results found for: CHOL, HDL, LDLCALC, LDLDIRECT, TRIG, CHOLHDL No results found for: HGBA1C No results found for: VITAMINB12 No results found for: TSH   05/14/19 CT head [I reviewed images myself and agree with interpretation. -VRP]  1. Uncontrollable motion artifact limits the exam. 2. No evidence of acute intracranial abnormality.  05/14/19 MRI brain [I reviewed images myself and agree with interpretation. -VRP]  - normal    ASSESSMENT AND PLAN  18 y.o. year old female here with new onset abnormal involuntary movements in November 2020, soon after a friend developed similar movements, history of anxiety and OCD type symptoms.   Dx:  1. Anxiety   2. Obsessive-compulsive disorder, unspecified type   3. Abnormal involuntary movement     PLAN:  ABNORMAL INVOLUNTARY MOVEMENTS / ANXIETY / OCD - may represent conversion reaction after exposure to knowledge of friend's similar symptoms - not likely tourette's syndrome due to sudden onset of symptoms (Nov 2020); associated with a triggered event (friend had similar symptoms prior to onset in patient) and age of onset (18 years old; usually tourette's and tic disorders start age 2-43 years old; although rarely these can start after age 33 years old) - check  labs to evaluate for other causes of movement disorders, anxiety, behaviors - refer to psychology counseling - may  consider SSRI in future  Orders Placed This Encounter  Procedures  . Vitamin B12  . TSH  . Ceruloplasmin  . Copper, Serum  . Ambulatory referral to Psychology   Return for pending if symptoms worsen or fail to improve.    Suanne Marker, MD 06/03/2019, 10:20 AM Certified in Neurology, Neurophysiology and Neuroimaging  [facility] [address] [place], [place] [address] [phone]

## 2019-06-08 LAB — TSH: TSH: 1 u[IU]/mL (ref 0.450–4.500)

## 2019-06-08 LAB — CERULOPLASMIN: Ceruloplasmin: 17.9 mg/dL — ABNORMAL LOW (ref 19.0–39.0)

## 2019-06-08 LAB — VITAMIN B12: Vitamin B-12: 300 pg/mL (ref 232–1245)

## 2019-06-08 LAB — COPPER, SERUM: Copper: 97 ug/dL (ref 72–166)

## 2019-06-13 ENCOUNTER — Telehealth: Payer: Self-pay | Admitting: *Deleted

## 2019-06-13 NOTE — Telephone Encounter (Signed)
LVM requesting call back for lab results.  

## 2019-06-15 ENCOUNTER — Encounter: Payer: Self-pay | Admitting: *Deleted

## 2020-10-07 IMAGING — CT CT HEAD W/O CM
3 of 6 series · 16 of 47 positions shown, 19 images · non-contrast
Comparison: None.

CLINICAL DATA: Ataxia, stroke suspected. Twitching of arms and
upper body.

EXAM:
CT HEAD WITHOUT CONTRAST
TECHNIQUE: Contiguous axial images were obtained from the base of the skull
through the vertex without intravenous contrast.

[Series 2: head wo · axial · 0.40mm/px · z∈[-139,-9]mm · 11 of 32 slices shown, 14 images]
[im 3/32  brain]
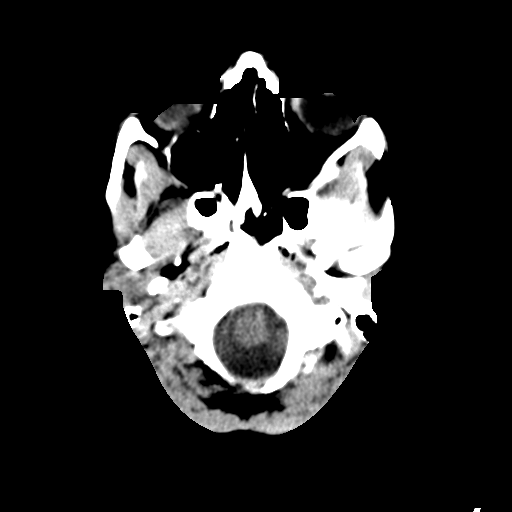
[im 3/32  bone]
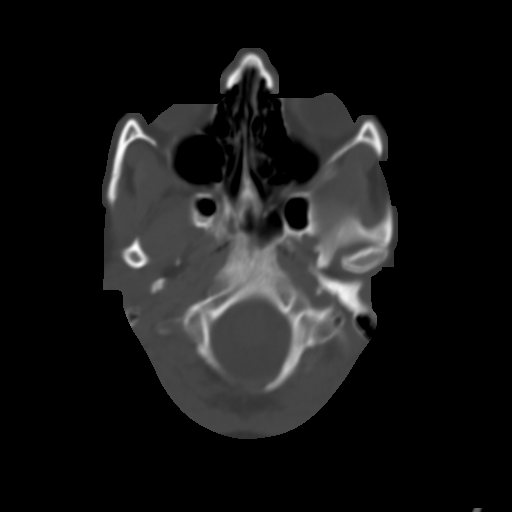
[im 5/32  brain]
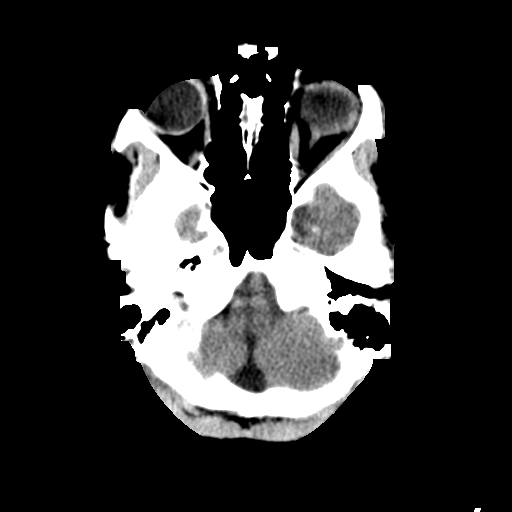
[im 7/32  brain]
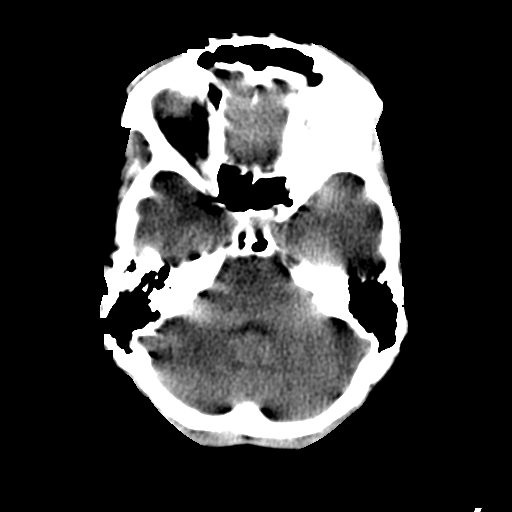
[im 12/32  brain]
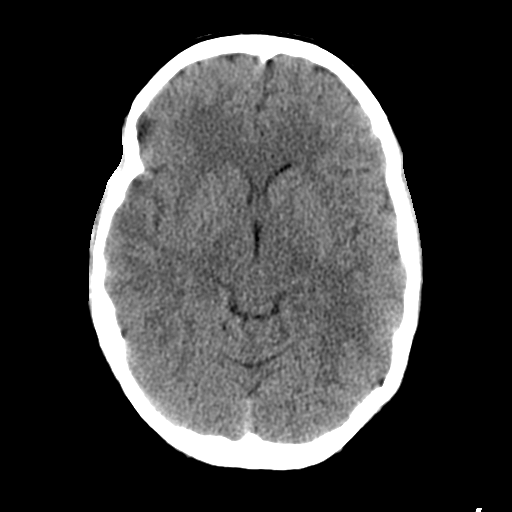
[im 14/32  brain]
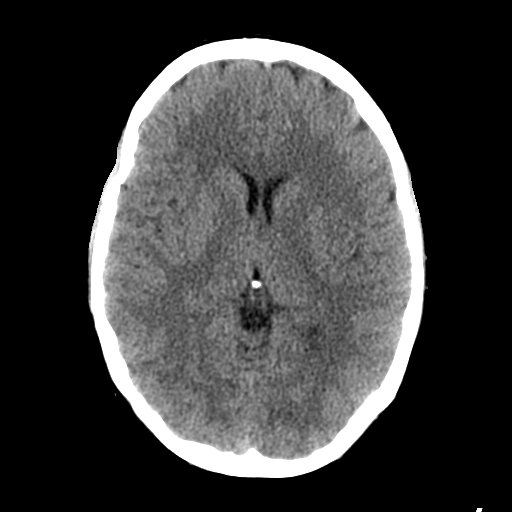
[im 14/32  bone]
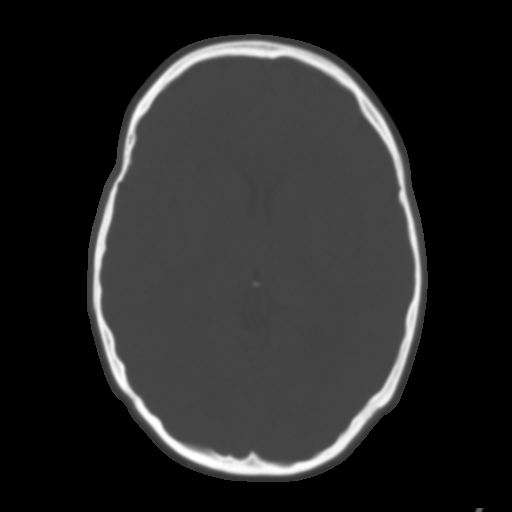
[im 16/32  brain]
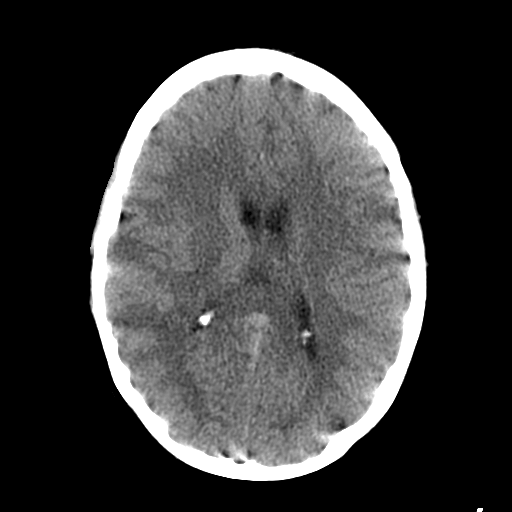
[im 18/32  brain]
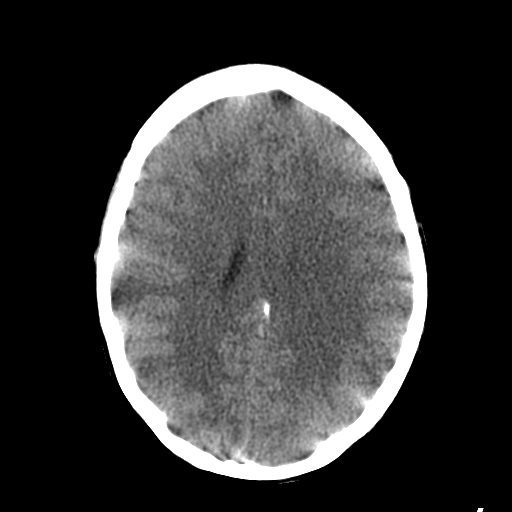
[im 20/32  brain]
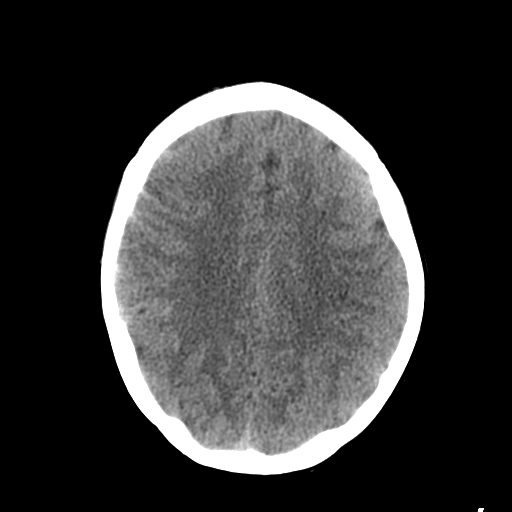
[im 25/32  brain]
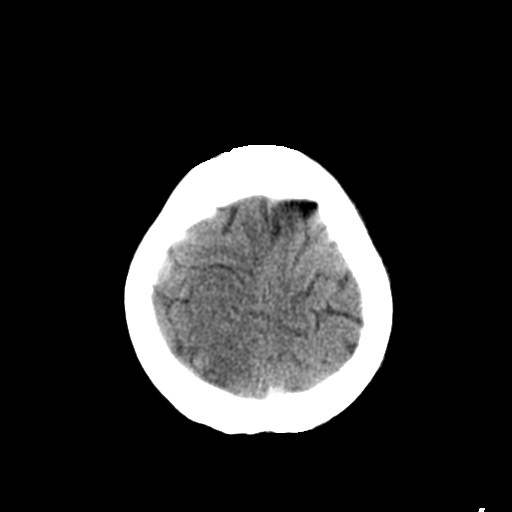
[im 25/32  bone]
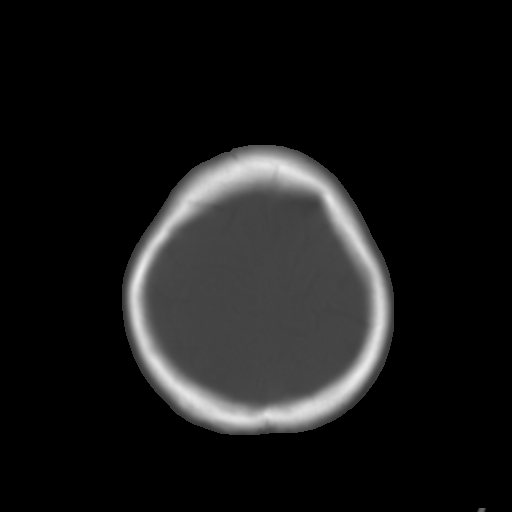
[im 27/32  brain]
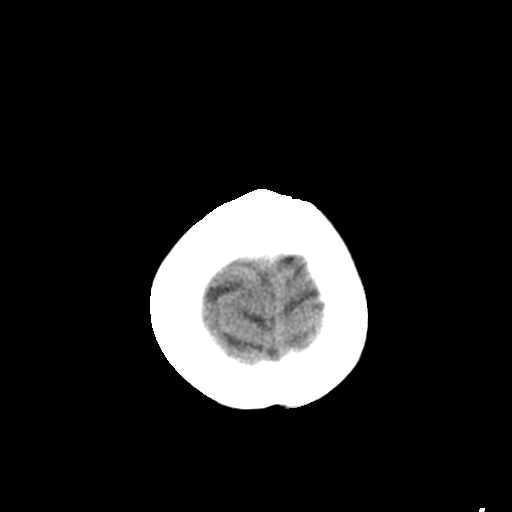
[im 29/32  brain]
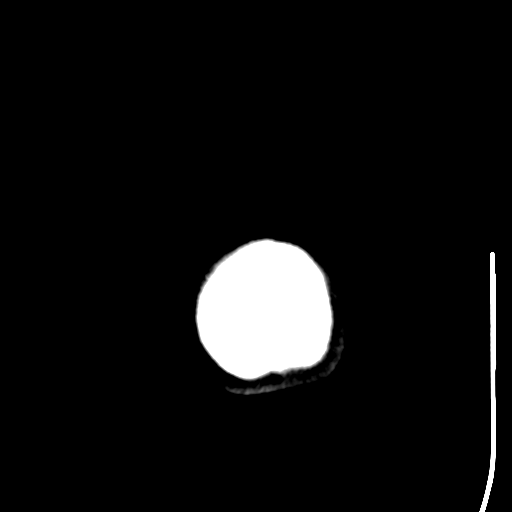

[Series 10: coronal soft tissue · coronal · 0.32mm/px · 3 of 69 slices shown]
[im 18/69  brain]
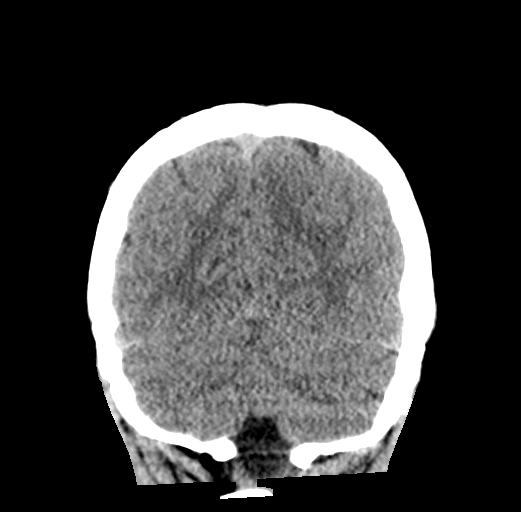
[im 35/69  brain]
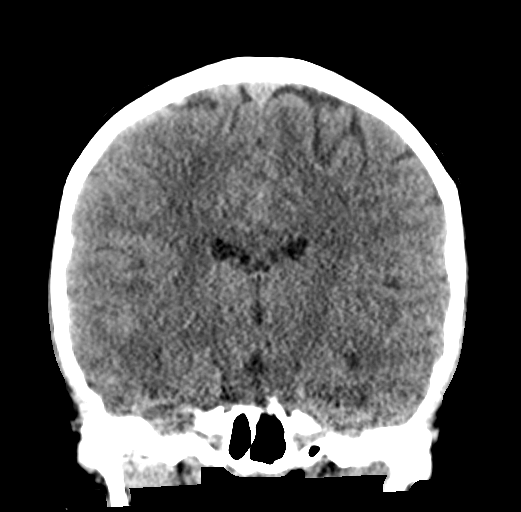
[im 52/69  brain]
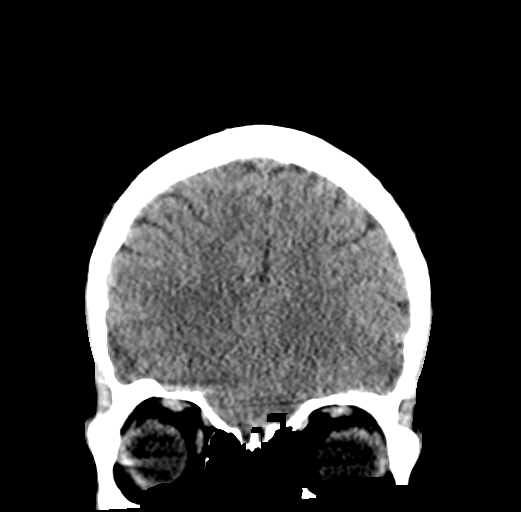

[Series 11: sagittal soft tissue · sagittal · 0.31mm/px · 2 of 56 slices shown]
[im 19/56  brain]
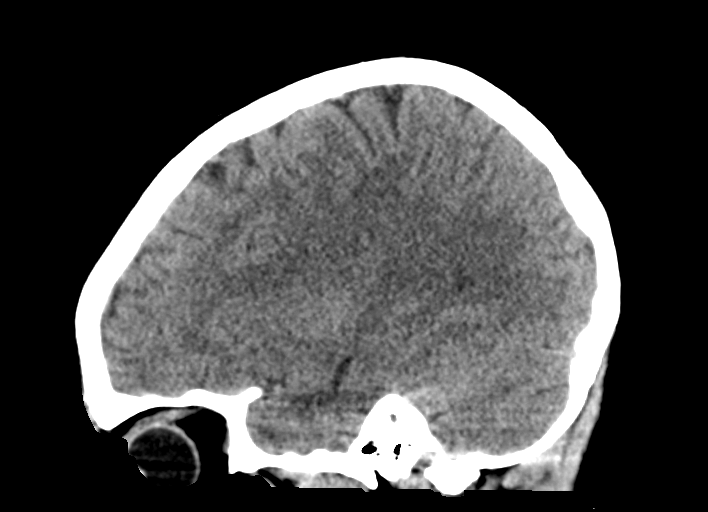
[im 37/56  brain]
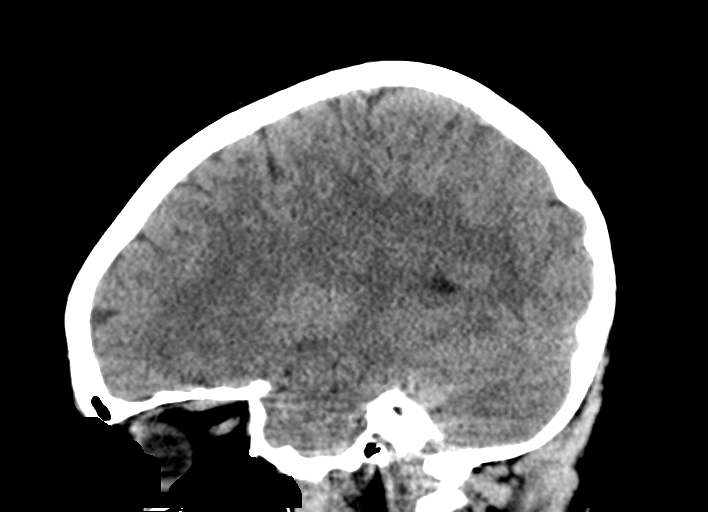

[16 of 47 positions shown; findings below may reference images not displayed]

FINDINGS: Brain: Uncontrollable motion artifact. No intracranial hemorrhage,
mass effect, or midline shift. No hydrocephalus. The basilar
cisterns are patent. No evidence of territorial infarct or acute
ischemia. No extra-axial or intracranial fluid collection.

Vascular: No obvious hyperdense vessel, motion artifact through the
area of the MCAs.

Skull: Normal. Negative for fracture or focal lesion.

Sinuses/Orbits: Paranasal sinuses and mastoid air cells are clear.
The visualized orbits are unremarkable.

Other: None.
IMPRESSION: 1. Uncontrollable motion artifact limits the exam.
2. No evidence of acute intracranial abnormality.

## 2020-10-07 IMAGING — MR MR HEAD W/O CM
12 of 13 series · 44 of 48 positions shown · non-contrast
Comparison: Head CT earlier today

CLINICAL DATA: Seizure, new, nontraumatic

EXAM:
MRI HEAD WITHOUT CONTRAST
TECHNIQUE: Multiplanar, multiecho pulse sequences of the brain and surrounding
structures were obtained without intravenous contrast.

[Series 5: DWI · axial · 3.0mm · 0.88mm/px · z∈[-118,+29]mm · 8 of 104 slices shown (1 of 4)]
[im 1/104]
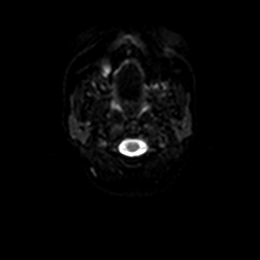
[im 15/104]
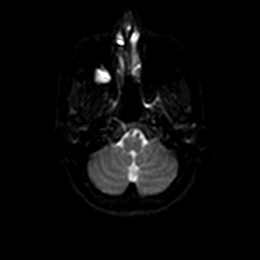
[im 30/104]
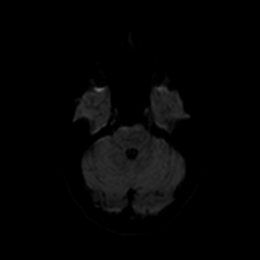
[im 45/104]
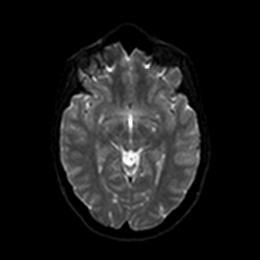
[im 59/104]
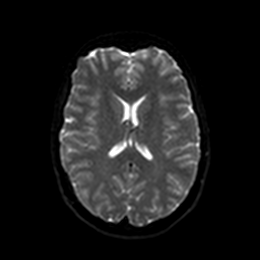
[im 74/104]
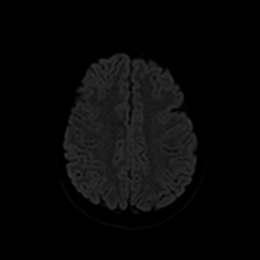
[im 89/104]
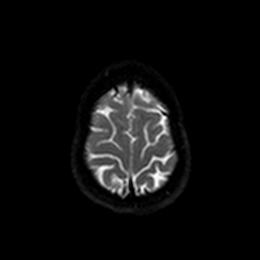
[im 104/104]
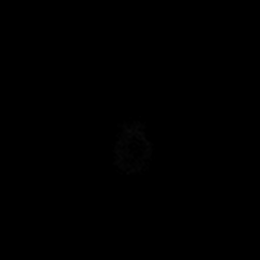

[Series 6: DWI · axial · 3.0mm · 0.88mm/px · z∈[-118,+29]mm · 4 of 52 slices shown (2 of 4)]
[im 1/52]
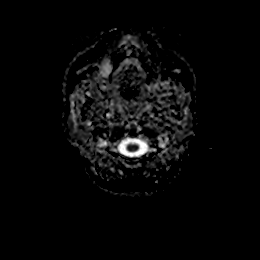
[im 18/52]
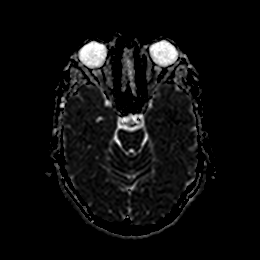
[im 35/52]
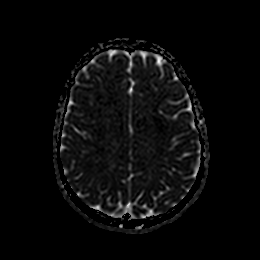
[im 52/52]
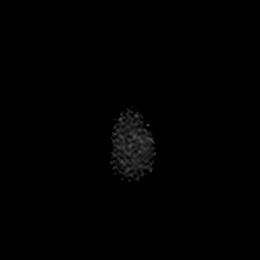

[Series 7: DWI · coronal · 4.0mm · 0.88mm/px · 5 of 72 slices shown (3 of 4)]
[im 1/72]
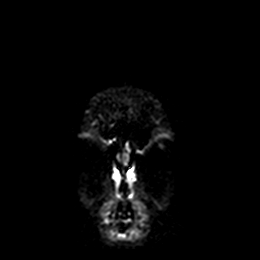
[im 18/72]
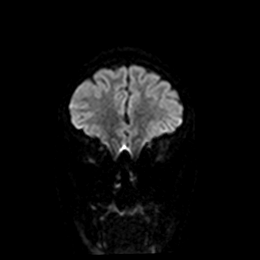
[im 36/72]
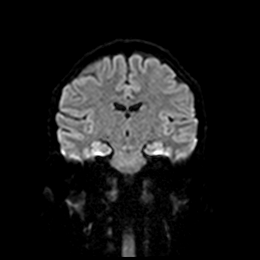
[im 54/72]
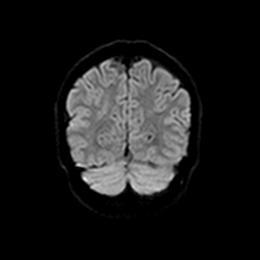
[im 72/72]
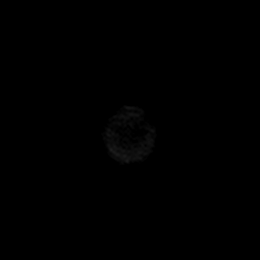

[Series 8: DWI · coronal · 4.0mm · 0.88mm/px · 3 of 36 slices shown (4 of 4)]
[im 1/36]
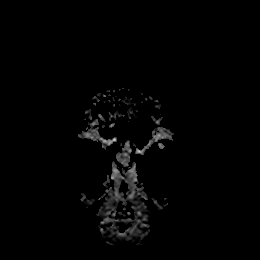
[im 18/36]
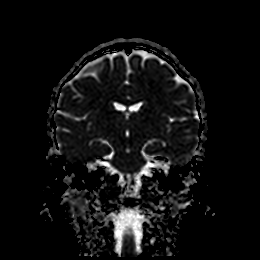
[im 36/36]
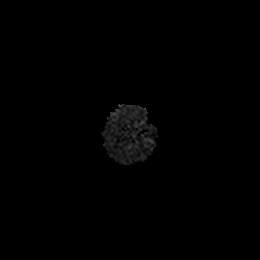

[Series 9: FLAIR · axial · 5.0mm · 0.45mm/px · z∈[-108,+33]mm · 2 of 25 slices shown]
[im 1/25]
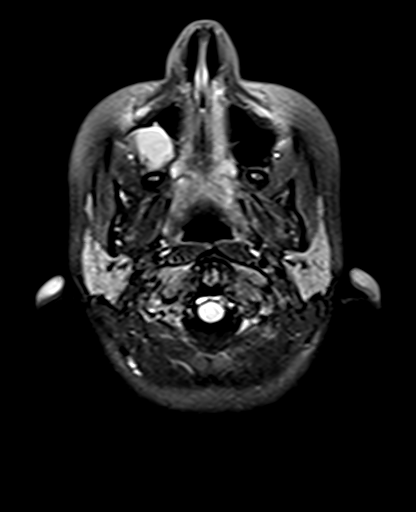
[im 25/25]
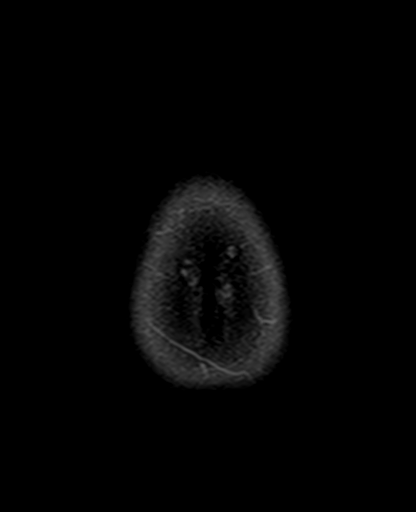

[Series 10: mag_images · axial · 3.0mm · 0.90mm/px · z∈[-119,+43]mm · 4 of 56 slices shown]
[im 1/56]
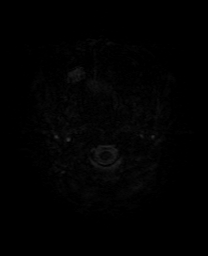
[im 19/56]
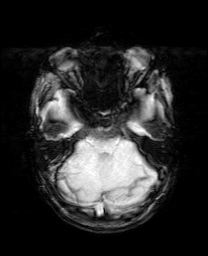
[im 37/56]
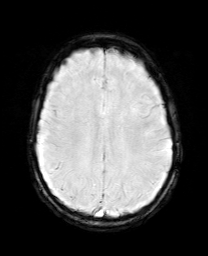
[im 56/56]
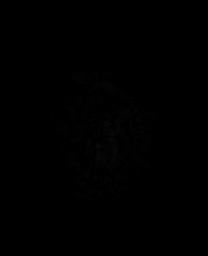

[Series 11: pha_images · axial · 3.0mm · 0.90mm/px · z∈[-119,+34]mm · 4 of 53 slices shown]
[im 1/53]
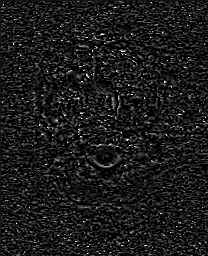
[im 18/53]
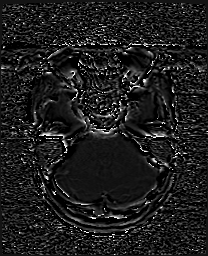
[im 35/53]
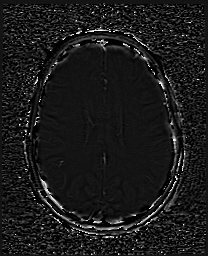
[im 53/53]
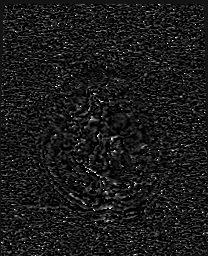

[Series 12: swi_images · axial · 3.0mm · 0.90mm/px · z∈[-119,+43]mm · 4 of 56 slices shown]
[im 1/56]
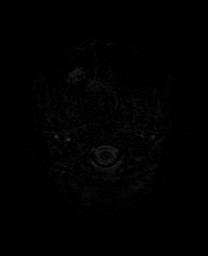
[im 19/56]
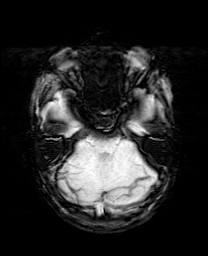
[im 37/56]
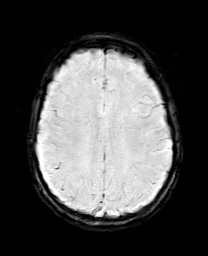
[im 56/56]
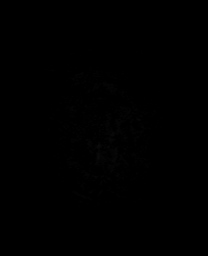

[Series 13: mip_images(sw) · axial · 24.0mm · 0.90mm/px · z∈[-108,+33]mm · 4 of 49 slices shown]
[im 1/49]
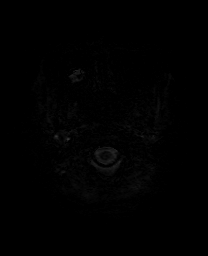
[im 17/49]
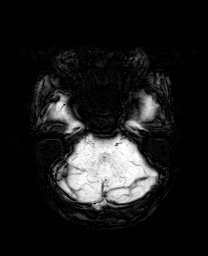
[im 33/49]
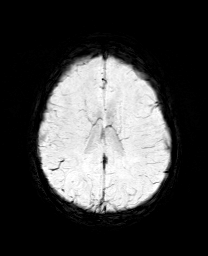
[im 49/49]
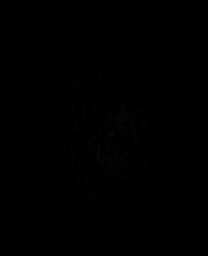

[Series 14: T1 · sagittal · 5.0mm · 0.75mm/px · 2 of 23 slices shown]
[im 1/23]
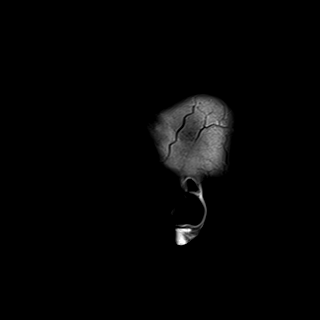
[im 23/23]
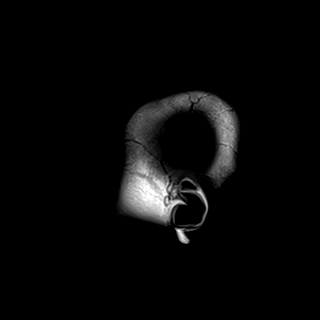

[Series 15: T2 · axial · 5.0mm · 0.72mm/px · z∈[-107,+34]mm · 2 of 25 slices shown (1 of 2)]
[im 1/25]
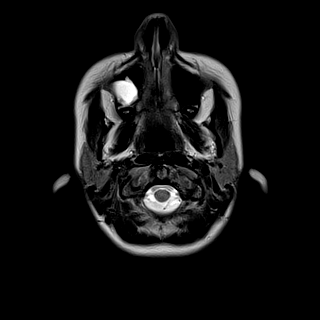
[im 25/25]
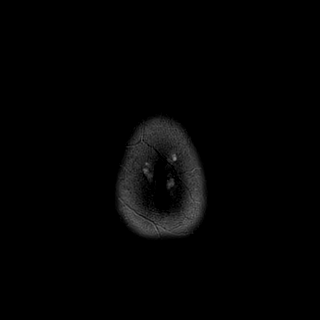

[Series 17: T2 · coronal · 5.0mm · 0.34mm/px · 2 of 30 slices shown (2 of 2)]
[im 1/30]
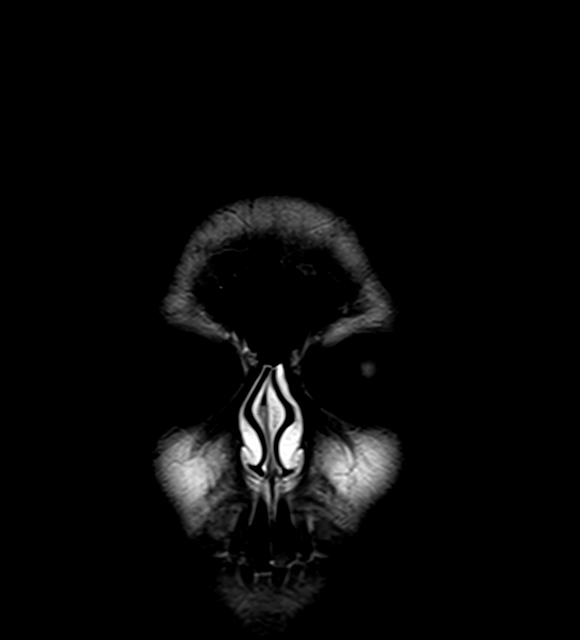
[im 30/30]
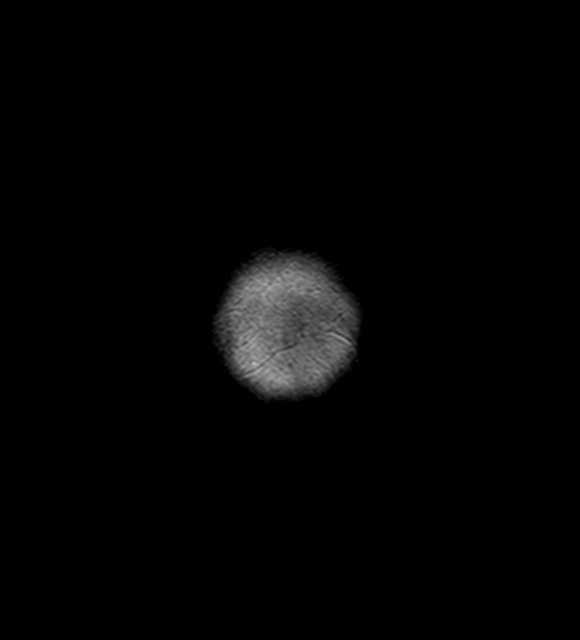

[44 of 48 positions shown; findings below may reference images not displayed]

FINDINGS: Brain: No infarction, hemorrhage, hydrocephalus, extra-axial
collection or mass lesion. Normal brain volume. No white matter
disease.

Vascular: Normal flow voids

Skull and upper cervical spine: Normal marrow signal

Sinuses/Orbits: Presumed retention cyst in the inferior right
maxillary sinus. Negative orbits
IMPRESSION: Normal brain MRI.

## 2021-10-14 ENCOUNTER — Encounter (HOSPITAL_COMMUNITY): Payer: Self-pay

## 2021-10-14 ENCOUNTER — Emergency Department (HOSPITAL_COMMUNITY)
Admission: EM | Admit: 2021-10-14 | Discharge: 2021-10-14 | Disposition: A | Discharge status: Home / Self Care | Payer: BC Managed Care – PPO | Attending: Emergency Medicine | Admitting: Emergency Medicine

## 2021-10-14 ENCOUNTER — Ambulatory Visit: Admission: EM | Admit: 2021-10-14 | Discharge: 2021-10-14 | Payer: BC Managed Care – PPO

## 2021-10-14 DIAGNOSIS — Z79899 Other long term (current) drug therapy: Secondary | ICD-10-CM | POA: Insufficient documentation

## 2021-10-14 DIAGNOSIS — R569 Unspecified convulsions: Secondary | ICD-10-CM | POA: Insufficient documentation

## 2021-10-14 MED ORDER — LORAZEPAM 1 MG PO TABS
1.0000 mg | ORAL_TABLET | Freq: Once | ORAL | Status: AC
  Administered 2021-10-14: 1 mg via ORAL
  Filled 2021-10-14: qty 1

## 2021-10-14 NOTE — ED Triage Notes (Signed)
Pt states to have had a few seizures this morning. Pt reports arm going stiff and having difficulty talking/slurred speech at the time. Pt denies any disorientation/confusion and reports being able to talk. Pt reports seizures lasted briefly for about a minute. Pt A/O, VSS, and ambulatory to triage.  ?

## 2021-10-14 NOTE — Discharge Instructions (Signed)
Call your primary care doctor or specialist as discussed in the next 2-3 days.   Return immediately back to the ER if:  Your symptoms worsen within the next 12-24 hours. You develop new symptoms such as new fevers, persistent vomiting, new pain, shortness of breath, or new weakness or numbness, or if you have any other concerns.  

## 2021-10-14 NOTE — ED Provider Notes (Signed)
?[facility] EMERGENCY DEPARTMENT ?Provider Note ? ? ?CSN: [id number] ?Arrival date & time: 10/14/21  5956 ? ?  ? ?History ? ?Chief Complaint  ?Patient presents with  ? Seizures  ? ? ?Tara Faulkner is a 21 y.o. female. ? ?Patient presents ER chief complaint of repetitive seizure-like episodes.  She had a history of spasms/seizure-like episodes in the past but has been doing well for the past few years.  And then today around 2 AM to 5 AM she had 3-4 episodes where her right arm and her left arm will go stiff for about 1 or 2 minutes before resolving and then reoccurring again.  These episodes occurred earlier this morning but have not since returned.  She denies any other headache or chest pain or abdominal pain.  No reports of fevers or cough or vomiting or diarrhea.  She is not on any seizure medications.  She had a work-up in the past about 3 years ago with negative MRI of the brain negative EEGs at the time. ? ? ?  ? ?Home Medications ?Prior to Admission medications   ?Medication Sig Start Date End Date Taking? Authorizing Provider  ?desonide (DESOWEN) 0.05 % cream APPLY TOPICALLY 2 (TWO) TIMES DAILY. ?Patient taking differently: Apply 1 application. topically daily as needed (eczema). 02/04/16  Yes Georgiann Hahn, MD  ?ibuprofen (ADVIL) 200 MG tablet Take 400-800 mg by mouth 2 (two) times daily as needed for cramping.   Yes [provider]  ?loratadine (CLARITIN) 10 MG tablet Take 20 mg by mouth daily.   Yes [provider]  ?melatonin 3 MG TABS tablet Take 3-6 mg by mouth at bedtime as needed (sleep).   Yes [provider]  ?OVER THE COUNTER MEDICATION Take 1 tablet by mouth daily. Unknown Migraine OTC medication.   Yes [provider]  ?ELIDEL 1 % cream APPLY TWICE DAILY TO RASH UNTIL CLEAR ?Patient not taking: Reported on 10/14/2021 03/28/13   Georgiann Hahn, MD  ?mometasone (ELOCON) 0.1 % cream Apply 1 application topically daily.    [provider]  ?   ? ?Allergies    ?Patient has no known allergies.   ? ?Review of Systems   ?Review of Systems  ?Constitutional:  Negative for fever.  ?HENT:  Negative for ear pain.   ?Eyes:  Negative for pain.  ?Respiratory:  Negative for cough.   ?Cardiovascular:  Negative for chest pain.  ?Gastrointestinal:  Negative for abdominal pain.  ?Genitourinary:  Negative for flank pain.  ?Musculoskeletal:  Negative for back pain.  ?Skin:  Negative for rash.  ?Neurological:  Negative for headaches.  ? ?Physical Exam ?Updated Vital Signs ?BP 108/76   Pulse 92   Temp 98.2 ?F (36.8 ?C)   Resp 16   Ht 5\' 1"  (1.549 m)   Wt 49.9 kg   SpO2 97%   BMI 20.78 kg/m?  ?Physical Exam ?Constitutional:   ?   General: She is not in acute distress. ?   Appearance: Normal appearance.  ?HENT:  ?   Head: Normocephalic.  ?   Nose: Nose normal.  ?Eyes:  ?   Extraocular Movements: Extraocular movements intact.  ?Cardiovascular:  ?   Rate and Rhythm: Normal rate.  ?Pulmonary:  ?   Effort: Pulmonary effort is normal.  ?Musculoskeletal:     ?   General: Normal range of motion.  ?   Cervical back: Normal range of motion.  ?Neurological:  ?   General: No focal deficit present.  ?  Mental Status: She is alert and oriented to person, place, and time. Mental status is at baseline.  ?   Cranial Nerves: No cranial nerve deficit.  ?   Motor: No weakness.  ?   Gait: Gait normal.  ? ? ?ED Results / Procedures / Treatments   ?Labs ?(all labs ordered are listed, but only abnormal results are displayed) ?Labs Reviewed - No data to display ? ?EKG ?None ? ?Radiology ?No results found. ? ?Procedures ?Procedures  ? ? ?Medications Ordered in ED ?Medications  ?LORazepam (ATIVAN) tablet 1 mg (1 mg Oral Given 10/14/21 1019)  ? ? ?ED Course/ Medical Decision Making/ A&P ?  ?                        ?Medical Decision Making ?Risk ?Prescription drug management. ? ? ?History obtained from her father at bedside as well. ? ?Cardiac monitoring showing sinus  rhythm. ? ?Chart review shows prior visits with neurology in 2020. ? ?Patient given Ativan here in the ER and observed for several hours.  No additional seizure activity noted.  Patient is awake and alert no focal neurodeficit.  I feel she safe for outpatient discharge.  Advise follow-up with neurology this week.  Advised immediate return for worsening symptoms recurrent seizure-like episodes or any additional concerns. ? ? ? ? ? ? ? ?Final Clinical Impression(s) / ED Diagnoses ?Final diagnoses:  ?Seizure-like activity (HCC)  ? ? ?Rx / DC Orders ?ED Discharge Orders   ? ? None  ? ?  ? ? ?  ?Cheryll Cockayne, MD ?10/14/21 1207 ? ?

## 2021-10-18 ENCOUNTER — Ambulatory Visit: Payer: BC Managed Care – PPO | Admitting: Diagnostic Neuroimaging

## 2021-10-18 ENCOUNTER — Encounter: Payer: Self-pay | Admitting: Diagnostic Neuroimaging

## 2021-10-18 VITALS — BP 113/74 | HR 122 | Ht 61.0 in | Wt 105.6 lb

## 2021-10-18 DIAGNOSIS — R259 Unspecified abnormal involuntary movements: Secondary | ICD-10-CM | POA: Diagnosis not present

## 2021-10-18 NOTE — Progress Notes (Signed)
? ?[facility] ? ?PATIENT: Tara Faulkner ?DOB: 12-18-2000 ? ?REFERRING CLINICIAN: No ref. provider found  ?HISTORY FROM: patient  ?REASON FOR VISIT: new consult ? ? ?HISTORICAL ? ?CHIEF COMPLAINT:  ?Chief Complaint  ?Patient presents with  ? Seizure-like activity  ?  Rm 6 ED referral, mom- Marylene Land  "history of seizure-like activity, never been on meds, gets stiff, twitching, has had shaking all over before, aware of it happening"  ? ? ?HISTORY OF PRESENT ILLNESS:  ? ?UPDATE (10/18/21, VRP): Since last visit, tics are improved, but persistent. Now with new events of muscle twitching, abnl movments of bilateral arms, legs; no LOC. Happened on 10/13/21, and went to ER for eval. Treated with ativan, and sxs improved. Had some anxietty attack a few weeks prior.  ? ?PRIOR HPI (06/03/19, VRP): 21 year old female here for evaluation of abnormal involuntary movements.   ? ?Patient has history of anxiety and seasonal depression.  Patient had normal birth and development.  She has been homeschooled until age 88 years old.  She has been living at home since that time, thinking about going to college but she is not sure yet.  Patient did have slightly increased depression symptoms in 2018 where she "did not move that much" at home and thought about seeing a counselor or getting a medication, but did not pursue this. ? ?Early November 2020, patient's friend developed involuntary movements and twitching and shared a video with the patient. Within 1 to 2 weeks the patient started developing similar symptoms herself around May 09, 2019.  Patient describes an uncontrollable urge and need to move her head, neck, shoulders, hands, sometimes associated with making a sound or seeing a particular word.  Sometimes these movements and sounds can be triggered if someone says a particular phrase to her where she sees something.  Apparently patient's brother even intentionally did this to the patient to see if he can trigger  these vocalizations and the patient. ? ?Symptoms gradually built up until May 14, 2019 when the severity of symptoms led patient to go to the emergency room for evaluation.  Lab testing, CT and MRI were obtained.  Neurology consultation was obtained.  All testing was essentially unremarkable.  Consideration of possible psychogenic movement disorder or conversion reaction was raised.  Seizure and Tourette syndrome were not felt to be likely.  Patient was referred to outpatient movement disorder specialist for follow-up. ? ?Patient denies any similar symptoms like this when she was younger in her preteen or teenage years.  Symptoms have been fairly sudden onset just in November 2020. ? ? ?REVIEW OF SYSTEMS: Full 14 system review of systems performed and negative with exception of: As per HPI. ? ?ALLERGIES: ?No Known Allergies ? ?HOME MEDICATIONS: ?Outpatient Medications Prior to Visit  ?Medication Sig Dispense Refill  ? desonide (DESOWEN) 0.05 % cream APPLY TOPICALLY 2 (TWO) TIMES DAILY. (Patient taking differently: Apply 1 application. topically daily as needed (eczema).) 60 g 6  ? ibuprofen (ADVIL) 200 MG tablet Take 400-800 mg by mouth 2 (two) times daily as needed for cramping.    ? loratadine (CLARITIN) 10 MG tablet Take 20 mg by mouth daily.    ? mometasone (ELOCON) 0.1 % cream Apply 1 application topically daily.    ? OVER THE COUNTER MEDICATION Take 1 tablet by mouth daily. Unknown Migraine OTC medication.    ? melatonin 3 MG TABS tablet Take 3-6 mg by mouth at bedtime as needed (sleep). (Patient not taking: Reported on 10/18/2021)    ?  ELIDEL 1 % cream APPLY TWICE DAILY TO RASH UNTIL CLEAR (Patient not taking: Reported on 10/14/2021) 60 g 1  ? ?No facility-administered medications prior to visit.  ? ? ?PAST MEDICAL HISTORY: ?Past Medical History:  ?Diagnosis Date  ? Allergy   ? Seizure-like activity (HCC)   ? ? ?PAST SURGICAL HISTORY: ?History reviewed. No pertinent surgical history. ? ?FAMILY  HISTORY: ?Family History  ?Problem Relation Age of Onset  ? Cancer Maternal Grandmother   ?     Uterine  ? Cancer Maternal Grandfather   ?     Liver ca  ? Hypertension Maternal Grandfather   ? Diabetes Paternal Grandmother   ? Arthritis Neg Hx   ? Asthma Neg Hx   ? COPD Neg Hx   ? Depression Neg Hx   ? Hyperlipidemia Neg Hx   ? Heart disease Neg Hx   ? Hearing loss Neg Hx   ? Early death Neg Hx   ? Drug abuse Neg Hx   ? Kidney disease Neg Hx   ? Learning disabilities Neg Hx   ? Mental illness Neg Hx   ? Mental retardation Neg Hx   ? Miscarriages / Stillbirths Neg Hx   ? Stroke Neg Hx   ? Vision loss Neg Hx   ? Alcohol abuse Neg Hx   ? Varicose Veins Neg Hx   ? ? ?SOCIAL HISTORY: ?Social History  ? ?Socioeconomic History  ? Marital status: Single  ?  Spouse name: Not on file  ? Number of children: 0  ? Years of education: 3612  ? Highest education level: Not on file  ?Occupational History  ? Not on file  ?Tobacco Use  ? Smoking status: Never  ? Smokeless tobacco: Never  ?Vaping Use  ? Vaping Use: Never used  ?Substance and Sexual Activity  ? Alcohol use: No  ? Drug use: No  ? Sexual activity: Never  ?Other Topics Concern  ? Not on file  ?Social History Narrative  ? HS grad., home schooled.  Not employed.  Living home. 1 brother (married).   ? ?Social Determinants of Health  ? ?Financial Resource Strain: Not on file  ?Food Insecurity: Not on file  ?Transportation Needs: Not on file  ?Physical Activity: Not on file  ?Stress: Not on file  ?Social Connections: Not on file  ?Intimate Partner Violence: Not on file  ? ? ? ?PHYSICAL EXAM ? ?GENERAL EXAM/CONSTITUTIONAL: ?Vitals:  ?Vitals:  ? 10/18/21 0904  ?BP: 113/74  ?Pulse: (!) 122  ?Weight: 105 lb 9.6 oz (47.9 kg)  ?Height: 5\' 1"  (1.549 m)  ? ?Body mass index is 19.95 kg/m?. ?Wt Readings from Last 3 Encounters:  ?10/18/21 105 lb 9.6 oz (47.9 kg)  ?10/14/21 110 lb (49.9 kg)  ?06/03/19 106 lb 3.2 oz (48.2 kg)  ? ?Patient is in no distress; well developed, nourished and  groomed; neck is supple ?INTERMITTENT VOCALIZATIONS (clicks, throat clearing) ? ?CARDIOVASCULAR: ?Examination of carotid arteries is normal; no carotid bruits ?Regular rate and rhythm, no murmurs ?Examination of peripheral vascular system by observation and palpation is normal ? ?EYES: ?Ophthalmoscopic exam of optic discs and posterior segments is normal; no papilledema or hemorrhages ?No results found. ? ?MUSCULOSKELETAL: ?Gait, strength, tone, movements noted in Neurologic exam below ? ?NEUROLOGIC: ?MENTAL STATUS:  ?   ? View : No data to display.  ?  ?  ?  ? ?awake, alert, oriented to person, place and time ?recent and remote memory intact ?normal attention and concentration ?language  fluent, comprehension intact, naming intact ?fund of knowledge appropriate ? ?CRANIAL NERVE:  ?2nd - no papilledema on fundoscopic exam ?2nd, 3rd, 4th, 6th - pupils equal and reactive to light, visual fields full to confrontation, extraocular muscles intact, no nystagmus ?5th - facial sensation symmetric ?7th - facial strength symmetric ?8th - hearing intact ?9th - palate elevates symmetrically, uvula midline ?11th - shoulder shrug symmetric ?12th - tongue protrusion midline ? ?MOTOR:  ?normal bulk and tone, full strength in the BUE, BLE ? ?SENSORY:  ?normal and symmetric to light touch, temperature, vibration ? ?COORDINATION:  ?finger-nose-finger, fine finger movements normal ? ?REFLEXES:  ?deep tendon reflexes present and symmetric ? ?GAIT/STATION:  ?narrow based gait ? ? ? ? ?DIAGNOSTIC DATA (LABS, IMAGING, TESTING) ?- I reviewed patient records, labs, notes, testing and imaging myself where available. ? ?Lab Results  ?Component Value Date  ? WBC 6.9 05/14/2019  ? HGB 14.5 05/14/2019  ? HCT 43.9 05/14/2019  ? MCV 88.0 05/14/2019  ? PLT 226 05/14/2019  ? ?   ?Component Value Date/Time  ? NA 142 05/14/2019 0553  ? K 3.7 05/14/2019 0553  ? CL 108 05/14/2019 0553  ? CO2 23 05/14/2019 0553  ? GLUCOSE 121 (H) 05/14/2019 0553  ? BUN 10  05/14/2019 0553  ? CREATININE 0.78 05/14/2019 0553  ? CALCIUM 9.8 05/14/2019 0553  ? GFRNONAA >60 05/14/2019 0553  ? GFRAA >60 05/14/2019 0553  ? ?No results found for: CHOL, HDL, LDLCALC, LDLDIRECT, TRIG, CHOLHDL ?No

## 2021-10-18 NOTE — Patient Instructions (Signed)
ABNORMAL MOVEMENTS ?- check MRI brain and EEG ?- psychiatry / psychology follow up for anxiety, ADHD, OC behavior evaluation ?

## 2021-10-22 ENCOUNTER — Telehealth: Payer: Self-pay | Admitting: Diagnostic Neuroimaging

## 2021-10-22 ENCOUNTER — Ambulatory Visit: Payer: BC Managed Care – PPO | Admitting: Diagnostic Neuroimaging

## 2021-10-22 DIAGNOSIS — R259 Unspecified abnormal involuntary movements: Secondary | ICD-10-CM | POA: Diagnosis not present

## 2021-10-22 NOTE — Telephone Encounter (Signed)
Referral for psychiatry sent to to [facility] [phone].  ?

## 2021-10-23 NOTE — Procedures (Signed)
? ?  [facility] ? ?EEG (ELECTROENCEPHALOGRAM) REPORT ? ? ?STUDY DATE: 10/22/21 ?PATIENT NAME: Tara Faulkner ?DOB: 16-Sep-2000 ?MRN: [id number] ? ?ORDERING CLINICIAN: Joycelyn Schmid, MD  ? ?TECHNOLOGIST: Marcheta Grammes ?TECHNIQUE: Electroencephalogram was recorded utilizing standard 10-20 system of lead placement and reformatted into average and bipolar montages.  ?RECORDING TIME: 23 minutes  ?ACTIVATION: hyperventilation and photic stimulation ? ?CLINICAL INFORMATION: 21 year old female with abnormal movements. ? ?FINDINGS: Posterior dominant background rhythms, which attenuate with eye opening, ranging 10-11 hertz and 40-50 microvolts. No focal, lateralizing, epileptiform activity or seizures are seen. Patient recorded in the awake and drowsy state. EKG channel shows regular rhythm of 100-110 beats per minute. ? ? ?IMPRESSION:  ? ?Normal EEG in the awake and drowsy states. ? ? ? ?INTERPRETING PHYSICIAN:  ?Suanne Marker, MD ?Certified in Neurology, Neurophysiology and Neuroimaging ? ?[facility] ?[address], [address] ?[place], [place] [address] ?[phone] ? ?

## 2021-10-28 ENCOUNTER — Telehealth: Payer: Self-pay | Admitting: Diagnostic Neuroimaging

## 2021-10-28 NOTE — Telephone Encounter (Signed)
Pt mother would like a call to discuss results of pt EEG that was done 10/22/2021.  ?[phone] ?

## 2021-10-28 NOTE — Telephone Encounter (Signed)
I returned the pt's mom call and updated on the EEG report. ?She verbalized understanding and sts she will wait to hear on setting up MRI. ? ?IMPRESSION:  ?  ?Normal EEG in the awake and drowsy states. ?  ?  ?  ?INTERPRETING PHYSICIAN:  ?Suanne Marker, MD ?Certified in Neurology, Neurophysiology and Neuroimaging ? ?

## 2021-10-29 ENCOUNTER — Telehealth: Payer: Self-pay | Admitting: Diagnostic Neuroimaging

## 2021-10-29 NOTE — Telephone Encounter (Signed)
45 mins MRI brain w/wo contrast Dr. Theresa Mulligan Berkley Harvey: [phone] exp. 10/21/21-11/19/20 ? ?Scheduled at [facility] 11/05/21 at 12:30PM ?

## 2021-11-04 NOTE — Telephone Encounter (Signed)
pt left vmail on my phone wanting to r/s i called and lvm for pt to call back to r/s ?

## 2021-11-05 ENCOUNTER — Other Ambulatory Visit: Payer: BC Managed Care – PPO

## 2021-11-05 NOTE — Telephone Encounter (Signed)
Patient rescheduled for May 30 at 4pm ?

## 2021-11-19 ENCOUNTER — Ambulatory Visit: Payer: BC Managed Care – PPO

## 2021-11-19 DIAGNOSIS — R259 Unspecified abnormal involuntary movements: Secondary | ICD-10-CM

## 2021-11-19 MED ORDER — GADOBENATE DIMEGLUMINE 529 MG/ML IV SOLN
5.0000 mL | Freq: Once | INTRAVENOUS | Status: AC | PRN
  Administered 2021-11-19: 5 mL via INTRAVENOUS

## 2021-11-21 ENCOUNTER — Telehealth: Payer: Self-pay

## 2021-11-21 NOTE — Telephone Encounter (Signed)
Mychart message sent to the pt.

## 2021-11-21 NOTE — Telephone Encounter (Signed)
-----   Message from Suanne Marker, MD sent at 11/20/2021  5:43 PM EDT ----- Normal MRI.

## 2021-11-28 NOTE — Telephone Encounter (Signed)
Pt's mother, Emeree [facility]) can not get into MyChart. Would like a call from the nurse to discuss what to do next.  Relayed MRI results were normal.

## 2021-11-28 NOTE — Telephone Encounter (Signed)
I called the pt's mom back we discussed the results of the EEG and MRI results and plan about seeing [place] [facility].   She sts the pt has not scheduled appt with this group yet. I advised this was in the plan from last visit so would be beneficial for consult on recommendation. She was agreeable to calling them and setting up appt. She reports pt symptoms remain the same no change, she will let us know if any issues come up with scheduling appt.

## 2021-12-31 NOTE — Telephone Encounter (Signed)
I called [facility] to check status on this. Had to leave a voicemail will update mom as well.

## 2021-12-31 NOTE — Telephone Encounter (Signed)
I called the pt's mom and updated on this information as well.

## 2021-12-31 NOTE — Telephone Encounter (Signed)
Pt's mother, Tara Faulkner (on [place]) said have been calling [facility] and left a voicemail and email address, but no one called back to schedule an appt. Would like a call from the nurse.

## 2022-01-06 NOTE — Telephone Encounter (Signed)
I called again today and left a vm with the new pt schedulers and the triage nurse in hope to get this pt scheduled with them. I have updated the mom on this as well via vm, also relayed we would be open to sending to another location if she would like to try.

## 2022-01-06 NOTE — Telephone Encounter (Signed)
I have called the attention specialists again this morning and had to leave another voicemail.  Will speak with referrals on this to see if there is possibly another number I can call.

## 2022-01-06 NOTE — Telephone Encounter (Signed)
Pt's mom returned my call and she is agreeable to trying another location since we are having issues with call backs.

## 2022-01-07 NOTE — Telephone Encounter (Signed)
Resent referral for Psychiatry to [facility] [phone].

## 2022-01-07 NOTE — Telephone Encounter (Signed)
Tara Faulkner  You 1 hour ago (9:41 AM)   AB Done, resent to [facility] [phone].   I have reached out to pt's mom and let her know where we have sent the referral, also provided the number.  Advised mom to call back with any questions/concerns.
